# Patient Record
Sex: Male | Born: 2000 | Race: White | Hispanic: Yes | Marital: Single | Smoking: Never smoker
Health system: Southern US, Community
[De-identification: ages and names within clinical notes are randomized; demographics above are authoritative.]

---
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
## 2022-01-03 ENCOUNTER — Other Ambulatory Visit: Payer: Self-pay

## 2022-01-03 ENCOUNTER — Encounter (HOSPITAL_COMMUNITY): Payer: Self-pay | Admitting: Emergency Medicine

## 2022-01-03 ENCOUNTER — Emergency Department (HOSPITAL_COMMUNITY): Payer: Medicaid Other

## 2022-01-03 ENCOUNTER — Emergency Department (HOSPITAL_COMMUNITY)
Admission: EM | Admit: 2022-01-03 | Discharge: 2022-01-03 | Disposition: A | Discharge status: Home / Self Care | Payer: Medicaid Other | Attending: Emergency Medicine | Admitting: Emergency Medicine

## 2022-01-03 DIAGNOSIS — M25572 Pain in left ankle and joints of left foot: Secondary | ICD-10-CM | POA: Diagnosis not present

## 2022-01-03 DIAGNOSIS — Y9366 Activity, soccer: Secondary | ICD-10-CM | POA: Diagnosis not present

## 2022-01-03 DIAGNOSIS — X501XXA Overexertion from prolonged static or awkward postures, initial encounter: Secondary | ICD-10-CM | POA: Insufficient documentation

## 2022-01-03 DIAGNOSIS — S99912A Unspecified injury of left ankle, initial encounter: Secondary | ICD-10-CM | POA: Diagnosis present

## 2022-01-03 DIAGNOSIS — S93402A Sprain of unspecified ligament of left ankle, initial encounter: Secondary | ICD-10-CM | POA: Diagnosis not present

## 2022-01-03 NOTE — ED Triage Notes (Signed)
Pt reports left ankle pain after "rolling it" while playing soccer. No deformity noted.

## 2022-01-03 NOTE — Progress Notes (Signed)
Orthopedic Tech Progress Note Patient Details:  Emrik Erhard [birth year] [id number]  Ortho Devices Type of Ortho Device: ASO, Crutches Ortho Device/Splint Location: LLE Ortho Device/Splint Interventions: Ordered, Application, Adjustment   Post Interventions Patient Tolerated: Well, Ambulated well Instructions Provided: Adjustment of device, Poper ambulation with device, Care of device  [place] A Denisa Enterline 01/03/2022, 12:22 PM

## 2022-01-03 NOTE — ED Notes (Signed)
No signs of distress. Awaiting ortho tech to apply splint to ankle and do crutch walking teaching.

## 2022-01-03 NOTE — ED Provider Notes (Signed)
[facility] EMERGENCY DEPARTMENT Provider Note   CSN: [id number] Arrival date & time: 01/03/22  [id number]     History  Chief Complaint  Patient presents with   Ankle Pain    Gary Robinson is a 21 y.o. male.  The history is provided by the patient. No language interpreter was used.  Ankle Pain Location:  Ankle Time since incident:  1 day Injury: yes   Mechanism of injury comment:  Rolled ankle playing soccer Ankle location:  R ankle Pain details:    Quality:  Aching and throbbing   Radiates to:  Does not radiate   Severity:  Moderate   Onset quality:  Sudden   Timing:  Constant   Progression:  Unchanged Chronicity:  New Dislocation: no   Relieved by:  Immobilization Worsened by:  Bearing weight Associated symptoms: decreased ROM       Home Medications Prior to Admission medications   Medication Sig Start Date End Date Taking? Authorizing Provider  ibuprofen (ADVIL,MOTRIN) 600 MG tablet Take 1 tablet (600 mg total) by mouth every 6 (six) hours as needed for moderate pain. 11/21/17   Benjamine Sprague, NP      Allergies    Patient has no known allergies.    Review of Systems   Review of Systems  Physical Exam Updated Vital Signs Temp 98.2 F (36.8 C) (Oral)   Resp 17  Physical Exam Vitals and nursing note reviewed.  Constitutional:      General: Gary Robinson is not in acute distress.    Appearance: Gary Robinson is well-developed. Gary Robinson is not diaphoretic.  HENT:     Head: Normocephalic and atraumatic.  Eyes:     General: No scleral icterus.    Conjunctiva/sclera: Conjunctivae normal.  Cardiovascular:     Rate and Rhythm: Normal rate and regular rhythm.     Heart sounds: Normal heart sounds.  Pulmonary:     Effort: Pulmonary effort is normal. No respiratory distress.     Breath sounds: Normal breath sounds.  Abdominal:     Palpations: Abdomen is soft.     Tenderness: There is no abdominal tenderness.  Musculoskeletal:     Cervical back:  Normal range of motion and neck supple.     Comments: There is swelling and tenderness over the lateral malleolus.No overt deformity. No tenderness over the medial aspect of the ankle. The fifth metatarsal is not tender. The ankle joint is intact without excessive opening on stressing.   Skin:    General: Skin is warm and dry.  Neurological:     Mental Status: Gary Robinson is alert.  Psychiatric:        Behavior: Behavior normal.    ED Results / Procedures / Treatments   Labs (all labs ordered are listed, but only abnormal results are displayed) Labs Reviewed - No data to display  EKG None  Radiology No results found.  Procedures Procedures    Medications Ordered in ED Medications - No data to display  ED Course/ Medical Decision Making/ A&P Clinical Course as of 01/03/22 1151  Sun Jan 03, 2022  1130 DG Ankle Complete Left I visualized and interpreted  [AH]    Clinical Course User Index [AH] Margarita Mail, PA-C                           Medical Decision Making Gary Robinson is a 21 y.o. male who presents to ED for left ankle pain after mechanical injury.  LLE NVI. Exam c/w ankle sprain. X-ray negative for acute injury. ASO brace and crutches provided in ED. Home care instructions including RICE and NSAID's discussed. Follow up with ortho if symptoms not improving in 1 week. All questions answered.    Problems Addressed: Sprain of left ankle, unspecified ligament, initial encounter: acute illness or injury  Amount and/or Complexity of Data Reviewed Radiology: ordered.      Final Clinical Impression(s) / ED Diagnoses Final diagnoses:  None    Rx / DC Orders ED Discharge Orders     None         Margarita Mail, PA-C 01/03/22 1152    Blanchie Dessert, MD 01/07/22 [id number]

## 2022-01-03 NOTE — Discharge Instructions (Addendum)
Ice and elevate your foot. Take tylenol or advil for pain. You may bear weight as tolerated.  Follow up with an orthopedic doctor if you have any worsening pain.

## 2022-03-10 ENCOUNTER — Other Ambulatory Visit: Payer: Self-pay

## 2022-03-10 ENCOUNTER — Emergency Department (HOSPITAL_COMMUNITY)
Admission: EM | Admit: 2022-03-10 | Discharge: 2022-03-10 | Disposition: A | Discharge status: Home / Self Care | Payer: Medicaid Other | Attending: Emergency Medicine | Admitting: Emergency Medicine

## 2022-03-10 ENCOUNTER — Encounter (HOSPITAL_COMMUNITY): Payer: Self-pay

## 2022-03-10 DIAGNOSIS — R1033 Periumbilical pain: Secondary | ICD-10-CM | POA: Diagnosis not present

## 2022-03-10 DIAGNOSIS — R7309 Other abnormal glucose: Secondary | ICD-10-CM | POA: Insufficient documentation

## 2022-03-10 DIAGNOSIS — R1013 Epigastric pain: Secondary | ICD-10-CM | POA: Diagnosis present

## 2022-03-10 DIAGNOSIS — D72829 Elevated white blood cell count, unspecified: Secondary | ICD-10-CM | POA: Diagnosis not present

## 2022-03-10 DIAGNOSIS — K5900 Constipation, unspecified: Secondary | ICD-10-CM | POA: Diagnosis not present

## 2022-03-10 LAB — URINALYSIS, ROUTINE W REFLEX MICROSCOPIC
Bilirubin Urine: NEGATIVE
Glucose, UA: NEGATIVE mg/dL
Ketones, ur: 20 mg/dL — AB
Leukocytes,Ua: NEGATIVE
Nitrite: NEGATIVE
Protein, ur: 30 mg/dL — AB
Specific Gravity, Urine: 1.031 — ABNORMAL HIGH (ref 1.005–1.030)
pH: 5 (ref 5.0–8.0)

## 2022-03-10 LAB — COMPREHENSIVE METABOLIC PANEL
ALT: 18 U/L (ref 0–44)
AST: 18 U/L (ref 15–41)
Albumin: 4.7 g/dL (ref 3.5–5.0)
Alkaline Phosphatase: 69 U/L (ref 38–126)
Anion gap: 10 (ref 5–15)
BUN: 15 mg/dL (ref 6–20)
CO2: 22 mmol/L (ref 22–32)
Calcium: 9.3 mg/dL (ref 8.9–10.3)
Chloride: 106 mmol/L (ref 98–111)
Creatinine, Ser: 0.87 mg/dL (ref 0.61–1.24)
GFR, Estimated: >60 mL/min (ref >60)
Glucose, Bld: 108 mg/dL — ABNORMAL HIGH (ref 70–99)
Potassium: 3.8 mmol/L (ref 3.5–5.1)
Sodium: 138 mmol/L (ref 135–145)
Total Bilirubin: 0.7 mg/dL (ref 0.3–1.2)
Total Protein: 8.1 g/dL (ref 6.5–8.1)

## 2022-03-10 LAB — CBC
HCT: 47.6 % (ref 39.0–52.0)
Hemoglobin: 16.1 g/dL (ref 13.0–17.0)
MCH: 30.8 pg (ref 26.0–34.0)
MCHC: 33.8 g/dL (ref 30.0–36.0)
MCV: 91.2 fL (ref 80.0–100.0)
Platelets: 199 10*3/uL (ref 150–400)
RBC: 5.22 MIL/uL (ref 4.22–5.81)
RDW: 12.5 % (ref 11.5–15.5)
WBC: 11.3 10*3/uL — ABNORMAL HIGH (ref 4.0–10.5)
nRBC: 0 % (ref 0.0–0.2)

## 2022-03-10 LAB — LIPASE, BLOOD: Lipase: 28 U/L (ref 11–51)

## 2022-03-10 MED ORDER — FAMOTIDINE 20 MG PO TABS: 20.0000 mg | ORAL_TABLET | Freq: Two times a day (BID) | ORAL | 0 refills | Status: AC

## 2022-03-10 MED ORDER — SUCRALFATE 1 G PO TABS: 1.0000 g | ORAL_TABLET | Freq: Three times a day (TID) | ORAL | 0 refills | Status: AC

## 2022-03-10 NOTE — ED Provider Notes (Signed)
[facility] EMERGENCY DEPARTMENT Provider Note   CSN: [id number] Arrival date & time: 03/10/22  0757     History  Chief Complaint  Patient presents with   Abdominal Pain    Gary Robinson is a 21 y.o. male.  Patient with no past surgical history presents to the emergency department for evaluation of 2 months of periumbilical and epigastric abdominal pain.  Offered Spanish interpreter, patient prefers to speak [place].  Patient states that the symptoms feel like a "cramp".  Initially symptoms were coming and going.  He states that pain does seem to be worse after he eats or drinks.  Does report eating a lot of fast food while working.  Yesterday was working out in the heat and wound and felt the symptoms more so.  He decided that he wanted to get checked today.  He reports "fever" and chills yesterday morning and yesterday evening.  Did not measure his temperature.  This resolved without any treatment.  Denies NSAID use stating he does not like to take pills.  States that he drinks a beer approximately once a week, no daily use.  He does use tobacco.  Denies chest pain, cough.  Denies diarrhea.  Has had some constipation.  No irritative UTI symptoms.       Home Medications Prior to Admission medications   Medication Sig Start Date End Date Taking? Authorizing Provider  famotidine (PEPCID) 20 MG tablet Take 1 tablet (20 mg total) by mouth 2 (two) times daily. 03/10/22  Yes Renne Crigler, PA-C  sucralfate (CARAFATE) 1 g tablet Take 1 tablet (1 g total) by mouth 4 (four) times daily -  with meals and at bedtime. 03/10/22  Yes Renne Crigler, PA-C      Allergies    Patient has no known allergies.    Review of Systems   Review of Systems  Physical Exam Updated Vital Signs BP 133/77 (BP Location: Right Arm)   Pulse 83   Temp 98 F (36.7 C)   Resp 16   Ht 6' (1.829 m)   Wt 81.6 kg   SpO2 97%   BMI 24.41 kg/m   Physical Exam Vitals and nursing note  reviewed.  Constitutional:      General: He is not in acute distress.    Appearance: He is well-developed.  HENT:     Head: Normocephalic and atraumatic.  Eyes:     General:        Right eye: No discharge.        Left eye: No discharge.     Conjunctiva/sclera: Conjunctivae normal.  Cardiovascular:     Rate and Rhythm: Normal rate and regular rhythm.     Heart sounds: Normal heart sounds.  Pulmonary:     Effort: Pulmonary effort is normal.     Breath sounds: Normal breath sounds.  Abdominal:     Palpations: Abdomen is soft.     Tenderness: There is abdominal tenderness (minimal) in the epigastric area and periumbilical area. There is no guarding or rebound. Negative signs include Murphy's sign, Rovsing's sign and McBurney's sign.  Musculoskeletal:     Cervical back: Normal range of motion and neck supple.  Skin:    General: Skin is warm and dry.  Neurological:     Mental Status: [name].     ED Results / Procedures / Treatments   Labs (all labs ordered are listed, but only abnormal results are displayed) Labs Reviewed  COMPREHENSIVE METABOLIC PANEL - Abnormal;  Notable for the following components:      Result Value   Glucose, Bld 108 (*)    All other components within normal limits  CBC - Abnormal; Notable for the following components:   WBC 11.3 (*)    All other components within normal limits  URINALYSIS, ROUTINE W REFLEX MICROSCOPIC - Abnormal; Notable for the following components:   Specific Gravity, Urine 1.031 (*)    Hgb urine dipstick SMALL (*)    Ketones, ur 20 (*)    Protein, ur 30 (*)    Bacteria, UA RARE (*)    All other components within normal limits  LIPASE, BLOOD    EKG None  Radiology No results found.  Procedures Procedures    Medications Ordered in ED Medications - No data to display  ED Course/ Medical Decision Making/ A&P    Patient seen and examined. History obtained directly from patient. Work-up including labs, imaging, EKG  ordered in triage, if performed, were reviewed.    Labs/EKG: Independently reviewed and interpreted.  This included: CBC with minimally elevated white blood cell count at 11.3, normal hemoglobin; CMP with normal electrolytes, glucose minimally elevated at 108, normal kidney function, normal liver function testing; lipase normal; UA without compelling signs of infection.  Imaging: None ordered.  Considered CT or ultrasound but patient's symptoms are not consistent with biliary colic or with appendicitis.  No rebound or guarding to suggest perforated viscus.  Medications/Fluids: None ordered  Most recent vital signs reviewed and are as follows: BP 140/87 (BP Location: Right Arm)   Pulse 79   Temp 97.7 F (36.5 C) (Oral)   Resp 18   Ht 6' (1.829 m)   Wt 81.6 kg   SpO2 98%   BMI 24.41 kg/m   Initial impression: Epigastric abdominal pain  Home treatment plan: Avoidance of spicy and greasy foods, decrease tobacco use, trial of Pepcid and Carafate.  Return instructions discussed with patient: The patient was urged to return to the Emergency Department immediately with worsening of current symptoms, worsening abdominal pain, persistent vomiting, blood noted in stools, fever, or any other concerns. The patient verbalized understanding.   Follow-up instructions discussed with patient: F/u with PCP in 7 days.                           Medical Decision Making Amount and/or Complexity of Data Reviewed Labs: ordered.  Risk Prescription drug management.   For this patient's complaint of abdominal pain, the following conditions were considered on the differential diagnosis: gastritis/PUD, enteritis/duodenitis, appendicitis, cholelithiasis/cholecystitis, cholangitis, pancreatitis, ruptured viscus, colitis, diverticulitis, small/large bowel obstruction, proctitis, cystitis, pyelonephritis, ureteral colic, aortic dissection, aortic aneurysm. Atypical chest etiologies were also considered including  ACS, PE, and pneumonia.   The patient's vital signs, pertinent lab work and imaging were reviewed and interpreted as discussed in the ED course. Hospitalization was considered for further testing, treatments, or serial exams/observation. However as patient is well-appearing, has a stable exam, and reassuring studies today, I do not feel that they warrant admission at this time. This plan was discussed with the patient who verbalizes agreement and comfort with this plan and seems reliable and able to return to the Emergency Department with worsening or changing symptoms.          Final Clinical Impression(s) / ED Diagnoses Final diagnoses:  Epigastric abdominal pain    Rx / DC Orders ED Discharge Orders  Ordered    famotidine (PEPCID) 20 MG tablet  2 times daily        03/10/22 0951    sucralfate (CARAFATE) 1 g tablet  3 times daily with meals & bedtime        03/10/22 0951              Renne Crigler, PA-C 03/10/22 1001    Gloris Manchester, MD 03/15/22 1818

## 2022-03-10 NOTE — Discharge Instructions (Addendum)
Please read and follow all provided instructions.  Your diagnoses today include:  1. Epigastric abdominal pain     Tests performed today include: Blood cell counts and platelets: white blood cell count was just slightly high Kidney and liver function tests Pancreas function test (called lipase) Urine test to look for infection Vital signs. See below for your results today.   Medications prescribed:  Pepcid (famotidine) - antihistamine  You can find this medication over-the-counter.   DO NOT exceed:  20mg  Pepcid every 12 hours  Carafate - for stomach upset and to protect your stomach  Take any prescribed medications only as directed.  Home care instructions:  Follow any educational materials contained in this packet.  Follow-up instructions: Please follow-up with your primary care provider in the next 7 days for further evaluation of your symptoms.    Return instructions:  SEEK IMMEDIATE MEDICAL ATTENTION IF: The pain does not go away or becomes severe  A temperature above 101F develops  Repeated vomiting occurs (multiple episodes)  The pain becomes localized to portions of the abdomen. The right side could possibly be appendicitis. In an adult, the left lower portion of the abdomen could be colitis or diverticulitis.  Blood is being passed in stools or vomit (bright red or black tarry stools)  You develop chest pain, difficulty breathing, dizziness or fainting, or become confused, poorly responsive, or inconsolable (young children) If you have any other emergent concerns regarding your health  Additional Information: Abdominal (belly) pain can be caused by many things. Your caregiver performed an examination and possibly ordered blood/urine tests and imaging (CT scan, x-rays, ultrasound). Many cases can be observed and treated at home after initial evaluation in the emergency department. Even though you are being discharged home, abdominal pain can be unpredictable.  Therefore, you need a repeated exam if your pain does not resolve, returns, or worsens. Most patients with abdominal pain don't have to be admitted to the hospital or have surgery, but serious problems like appendicitis and gallbladder attacks can start out as nonspecific pain. Many abdominal conditions cannot be diagnosed in one visit, so follow-up evaluations are very important.  Your vital signs today were: BP 133/77 (BP Location: Right Arm)   Pulse 83   Temp 98 F (36.7 C)   Resp 16   Ht 6' (1.829 m)   Wt 81.6 kg   SpO2 97%   BMI 24.41 kg/m  If your blood pressure (bp) was elevated above 135/85 this visit, please have this repeated by your doctor within one month. --------------

## 2022-03-10 NOTE — ED Triage Notes (Signed)
Pt reports intermittent epigastric abd pain for the past 2 months. States he woke up with a fever yesterday. Denies n/v

## 2022-03-21 ENCOUNTER — Emergency Department (HOSPITAL_COMMUNITY)
Admission: EM | Admit: 2022-03-21 | Discharge: 2022-03-22 | Disposition: A | Discharge status: Home / Self Care | Payer: Medicaid Other | Attending: Emergency Medicine | Admitting: Emergency Medicine

## 2022-03-21 DIAGNOSIS — N50819 Testicular pain, unspecified: Secondary | ICD-10-CM | POA: Diagnosis not present

## 2022-03-21 DIAGNOSIS — R1032 Left lower quadrant pain: Secondary | ICD-10-CM | POA: Insufficient documentation

## 2022-03-21 DIAGNOSIS — Z5321 Procedure and treatment not carried out due to patient leaving prior to being seen by health care provider: Secondary | ICD-10-CM | POA: Diagnosis not present

## 2022-03-21 LAB — CBC
HCT: 44.5 % (ref 39.0–52.0)
Hemoglobin: 15.4 g/dL (ref 13.0–17.0)
MCH: 31.2 pg (ref 26.0–34.0)
MCHC: 34.6 g/dL (ref 30.0–36.0)
MCV: 90.3 fL (ref 80.0–100.0)
Platelets: 264 10*3/uL (ref 150–400)
RBC: 4.93 MIL/uL (ref 4.22–5.81)
RDW: 12.1 % (ref 11.5–15.5)
WBC: 9.1 10*3/uL (ref 4.0–10.5)
nRBC: 0 % (ref 0.0–0.2)

## 2022-03-21 LAB — URINALYSIS, ROUTINE W REFLEX MICROSCOPIC
Bilirubin Urine: NEGATIVE
Glucose, UA: NEGATIVE mg/dL
Hgb urine dipstick: NEGATIVE
Ketones, ur: NEGATIVE mg/dL
Leukocytes,Ua: NEGATIVE
Nitrite: NEGATIVE
Protein, ur: NEGATIVE mg/dL
Specific Gravity, Urine: 1.02 (ref 1.005–1.030)
pH: 6 (ref 5.0–8.0)

## 2022-03-21 LAB — COMPREHENSIVE METABOLIC PANEL
ALT: 16 U/L (ref 0–44)
AST: 15 U/L (ref 15–41)
Albumin: 4.3 g/dL (ref 3.5–5.0)
Alkaline Phosphatase: 67 U/L (ref 38–126)
Anion gap: 5 (ref 5–15)
BUN: 12 mg/dL (ref 6–20)
CO2: 23 mmol/L (ref 22–32)
Calcium: 9.2 mg/dL (ref 8.9–10.3)
Chloride: 110 mmol/L (ref 98–111)
Creatinine, Ser: 0.87 mg/dL (ref 0.61–1.24)
GFR, Estimated: >60 mL/min (ref >60)
Glucose, Bld: 98 mg/dL (ref 70–99)
Potassium: 3.8 mmol/L (ref 3.5–5.1)
Sodium: 138 mmol/L (ref 135–145)
Total Bilirubin: 0.7 mg/dL (ref 0.3–1.2)
Total Protein: 7 g/dL (ref 6.5–8.1)

## 2022-03-21 LAB — LIPASE, BLOOD: Lipase: 31 U/L (ref 11–51)

## 2022-03-21 NOTE — ED Triage Notes (Signed)
Pt c/o sudden onset LRQ abd pain at 7p, worse when using the bathroom. Endorses brief episode of testicular pain "earlier," but denies additional symptoms now. Denies N/V, associated symptoms.

## 2022-03-22 NOTE — ED Notes (Signed)
X2 no response for vitals recheck  

## 2023-11-02 IMAGING — DX DG ANKLE COMPLETE 3+V*L*
3 series · 3 of 3 positions shown · non-contrast
Comparison: None Available.

CLINICAL DATA: Acute LEFT ankle pain following injury yesterday.
Initial encounter.

EXAM:
LEFT ANKLE COMPLETE - 3+ VIEW

[ankle ap]
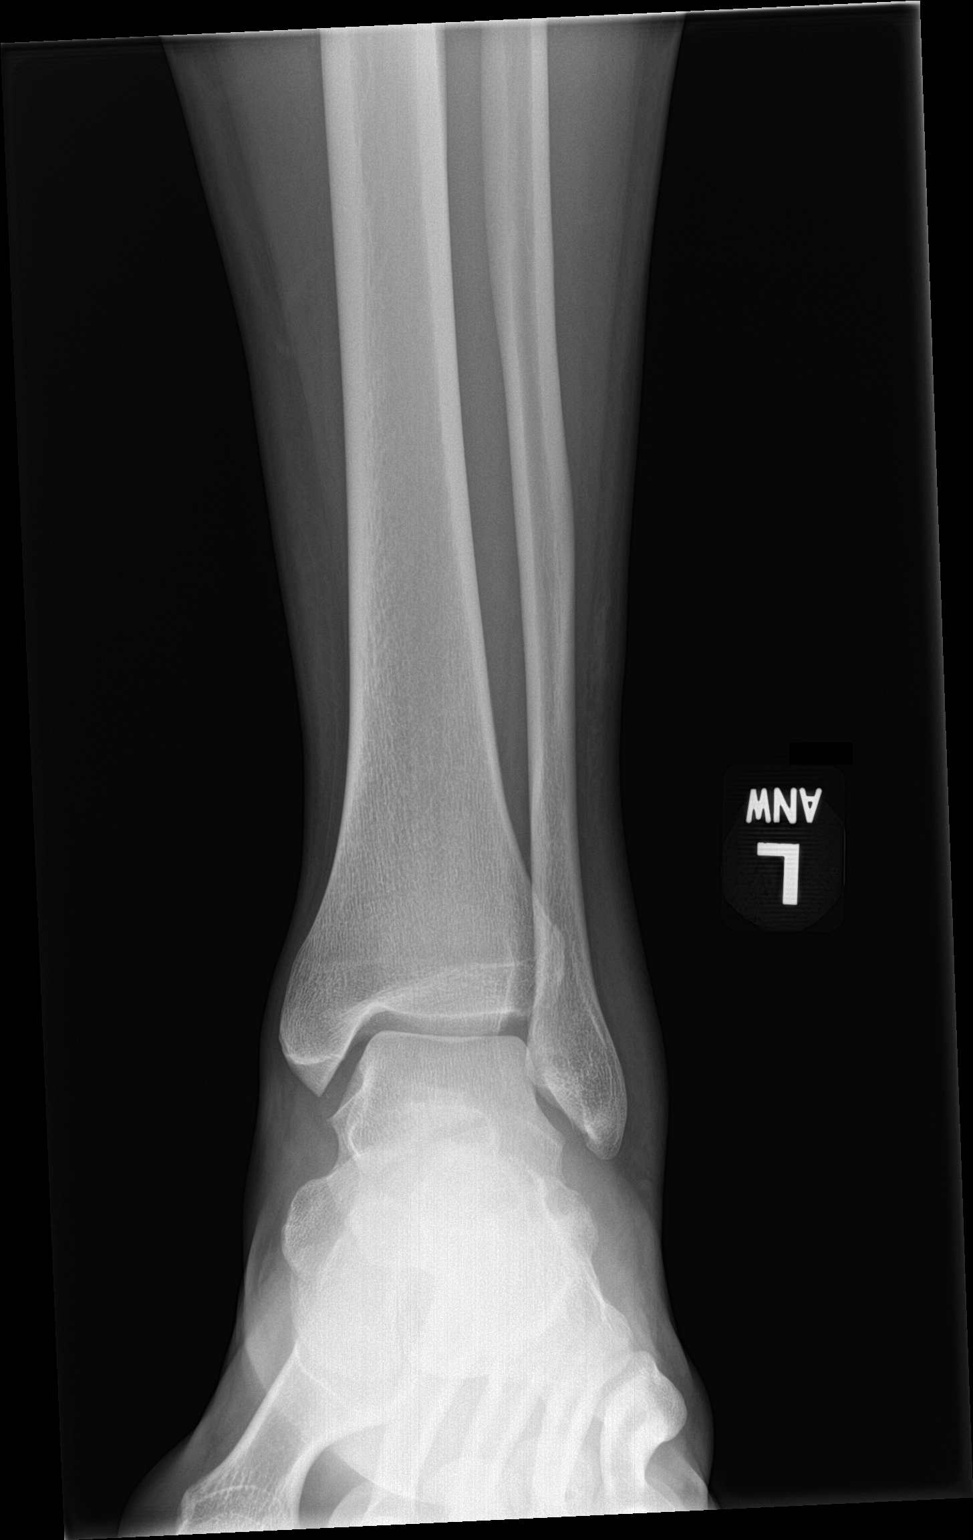

[ankle obl]
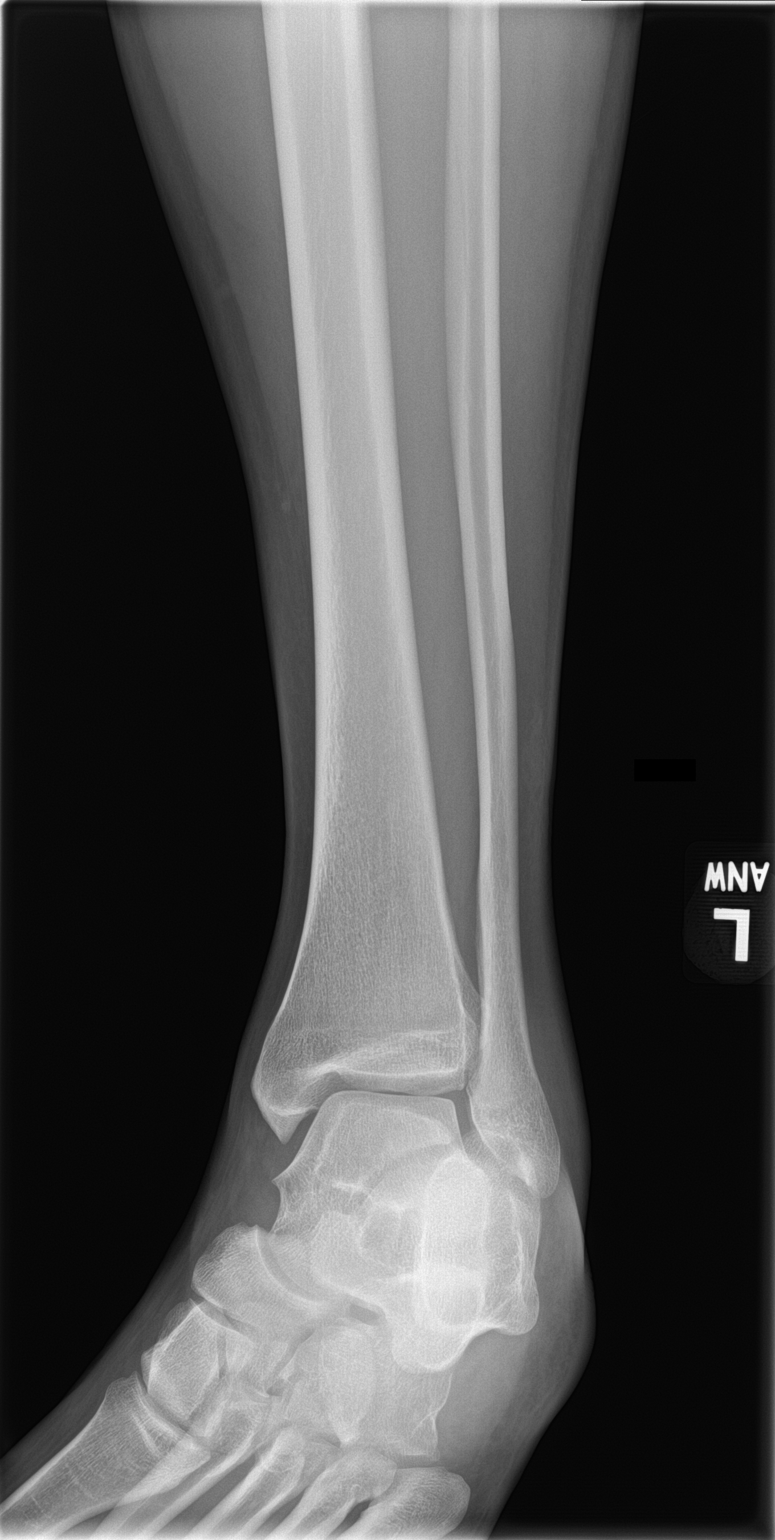

[ankle lat]
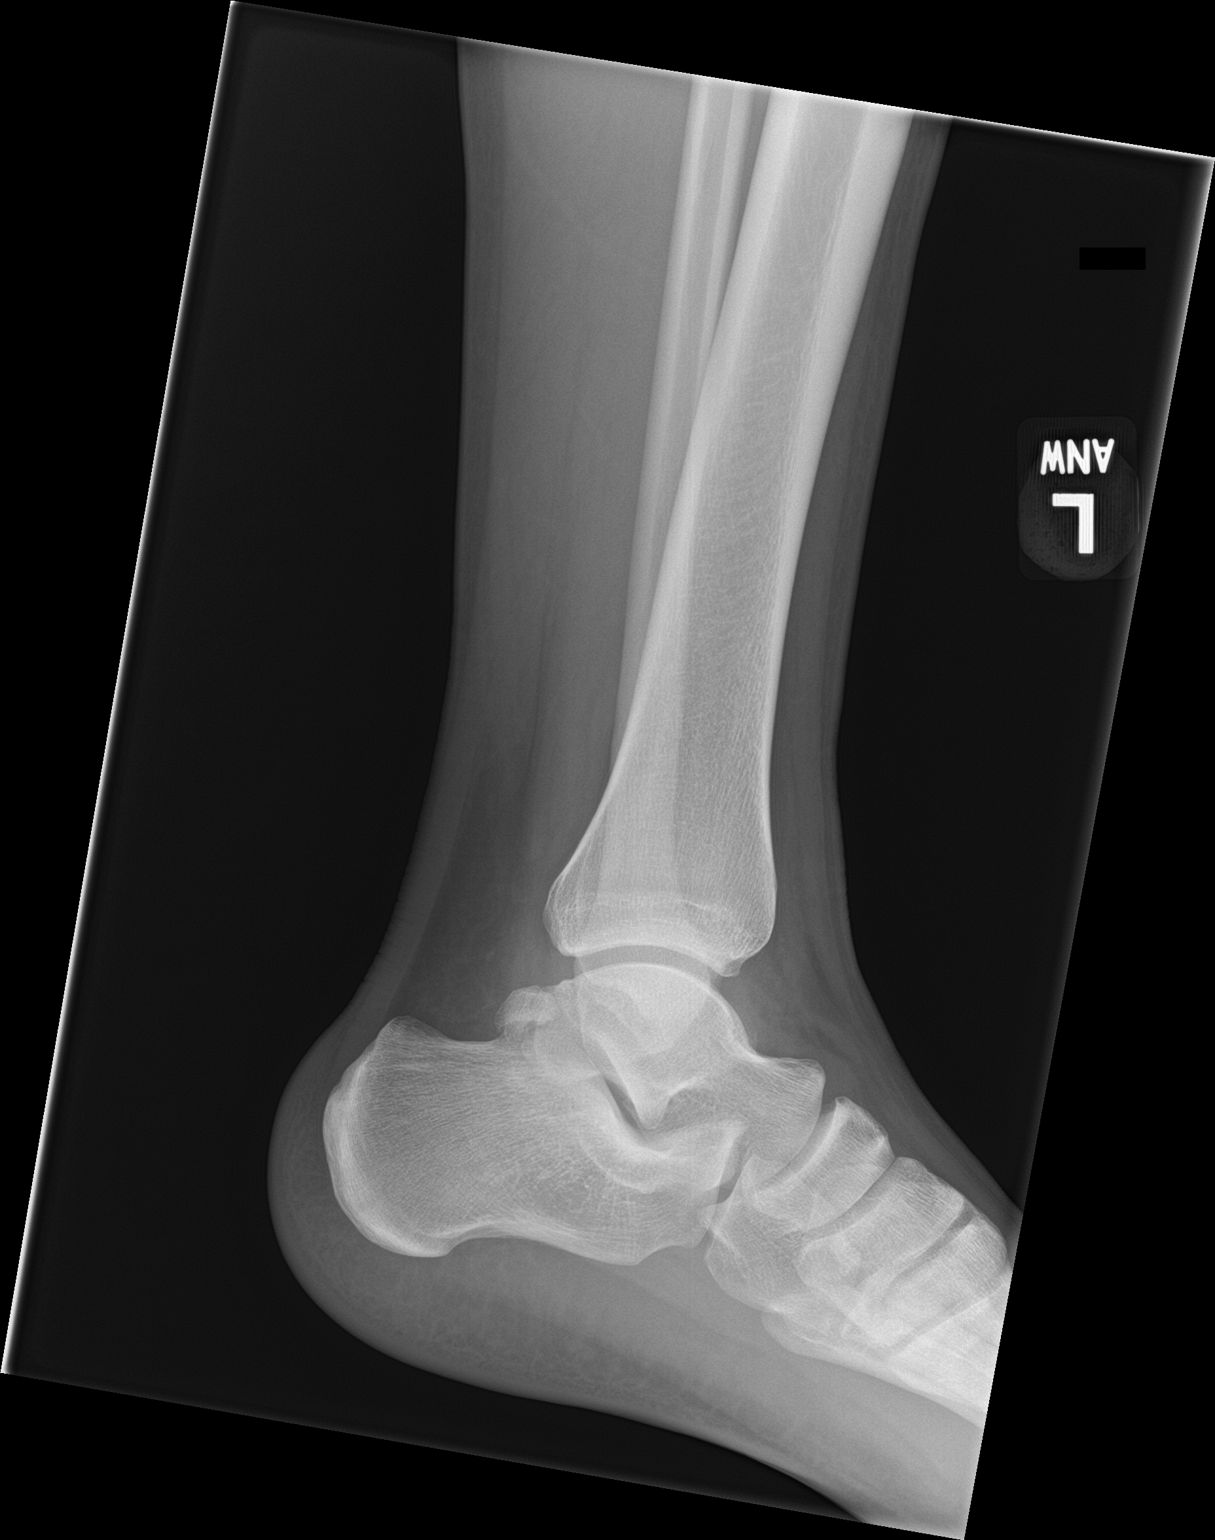

[3 of 3 positions shown; findings below may reference images not displayed]

FINDINGS: There is no evidence of acute fracture, subluxation or dislocation.

The joint spaces are unremarkable.

LATERAL soft tissue swelling is noted.

No focal bony lesions are noted.
IMPRESSION: LATERAL soft tissue swelling without acute bony abnormality.

## 2024-01-11 ENCOUNTER — Encounter (HOSPITAL_COMMUNITY): Payer: Self-pay

## 2024-01-11 ENCOUNTER — Ambulatory Visit (HOSPITAL_COMMUNITY)
Admission: EM | Admit: 2024-01-11 | Discharge: 2024-01-11 | Disposition: A | Discharge status: Home / Self Care | Attending: Emergency Medicine | Admitting: Emergency Medicine

## 2024-01-11 DIAGNOSIS — H1013 Acute atopic conjunctivitis, bilateral: Secondary | ICD-10-CM

## 2024-01-11 MED ORDER — OLOPATADINE HCL 0.1 % OP SOLN: 1.0000 [drp] | Freq: Two times a day (BID) | OPHTHALMIC | 0 refills | Status: AC

## 2024-01-11 NOTE — ED Triage Notes (Signed)
 Pt c/o burning pain to bilat eyes (R more so than L) x3-4 days. The burning occurs when his eyes are watered and/or when exposed to bright lights. Pt states he was working over the weekend and the sunlight reflecting off the metal was burning his eyes. No concern for foreign body. States has had intermittent blurred vision.

## 2024-01-11 NOTE — ED Provider Notes (Signed)
 [facility]    CSN: [id number] Arrival date & time: 01/11/24  1457      History   Chief Complaint Chief Complaint  Patient presents with   Eye Problem    HPI Gary Robinson is a 23 y.o. male.   Patient presents with bilateral eye irritation and itching x 3 to 4 days.  Patient states that his right eye at times is more irritated and itching more than his left.  Patient states over the weekend he was working outside and this is when this began.    Patient states that sometimes when light hits his eyes it irritates the eye more.  Patient denies foreign body sensation, or eye pain.  Patient states that occasionally when looking up a light his eyes will become watery and he will start to have some mild blurred vision with this.  Denies persistent blurred vision, dizziness, headache, congestion.  The history is provided by the patient and medical records.  Eye Problem   History reviewed. No pertinent past medical history.  There are no active problems to display for this patient.   History reviewed. No pertinent surgical history.     Home Medications    Prior to Admission medications   Medication Sig Start Date End Date Taking? Authorizing Provider  olopatadine (PATADAY) 0.1 % ophthalmic solution Place 1 drop into both eyes 2 (two) times daily. 01/11/24  Yes Rosevelt Constable, Kalimah Capurro A, NP  famotidine  (PEPCID ) 20 MG tablet Take 1 tablet (20 mg total) by mouth 2 (two) times daily. 03/10/22   Geiple, Joshua, PA-C  sucralfate  (CARAFATE ) 1 g tablet Take 1 tablet (1 g total) by mouth 4 (four) times daily -  with meals and at bedtime. 03/10/22   Lyna Sandhoff, PA-C    Family History History reviewed. No pertinent family history.  Social History Social History   Tobacco Use   Smoking status: Never  Vaping Use   Vaping status: Never Used  Substance Use Topics   Alcohol use: No   Drug use: Yes    Types: Marijuana     Allergies   Patient has no known  allergies.   Review of Systems Review of Systems  Per HPI  Physical Exam Triage Vital Signs ED Triage Vitals  Encounter Vitals Group     BP 01/11/24 1632 134/83     Systolic BP Percentile --      Diastolic BP Percentile --      Pulse Rate 01/11/24 1632 63     Resp 01/11/24 1632 18     Temp 01/11/24 1632 98.3 F (36.8 C)     Temp Source 01/11/24 1632 Oral     SpO2 01/11/24 1632 97 %     Weight --      Height --      Head Circumference --      Peak Flow --      Pain Score 01/11/24 1631 0     Pain Loc --      Pain Education --      Exclude from Growth Chart --    No data found.  Updated Vital Signs BP 134/83 (BP Location: Left Arm)   Pulse 63   Temp 98.3 F (36.8 C) (Oral)   Resp 18   SpO2 97%   Visual Acuity Right Eye Distance: 20/20 Left Eye Distance: 20/15 Bilateral Distance: 20/15  Right Eye Near:   Left Eye Near:    Bilateral Near:     Physical Exam Vitals and  nursing note reviewed.  Constitutional:      General: He is awake. He is not in acute distress.    Appearance: Normal appearance. He is well-developed and well-groomed. He is not ill-appearing.  Eyes:     General: Lids are normal.        Right eye: No discharge.        Left eye: No discharge.     Extraocular Movements: Extraocular movements intact.     Conjunctiva/sclera:     Right eye: Right conjunctiva is injected. No exudate or hemorrhage.    Left eye: Left conjunctiva is not injected. No exudate or hemorrhage.    Pupils: Pupils are equal, round, and reactive to light.  Neurological:     Mental Status: [name].  Psychiatric:        Behavior: Behavior is cooperative.      UC Treatments / Results  Labs (all labs ordered are listed, but only abnormal results are displayed) Labs Reviewed - No data to display  EKG   Radiology No results found.  Procedures Procedures (including critical care time)  Medications Ordered in UC Medications - No data to display  Initial  Impression / Assessment and Plan / UC Course  I have reviewed the triage vital signs and the nursing notes.  Pertinent labs & imaging results that were available during my care of the patient were reviewed by me and considered in my medical decision making (see chart for details).     Patient is well-appearing.  Vitals are stable.  Upon assessment there is mild injection noted to the right conjunctiva.  There is no discharge or swelling present.  Visual acuity within normal limits.  Prescribed Pataday for allergic conjunctivitis.  Discussed return precautions. Final Clinical Impressions(s) / UC Diagnoses   Final diagnoses:  Allergic conjunctivitis of both eyes     Discharge Instructions      Apply Pataday eyedrops twice daily as needed for eye irritation and itching. Return symptoms persist or worsen.  ED Prescriptions     Medication Sig Dispense Auth. Provider   olopatadine (PATADAY) 0.1 % ophthalmic solution Place 1 drop into both eyes 2 (two) times daily. 5 mL Levora Reas A, NP      PDMP not reviewed this encounter.   Levora Reas A, NP 01/11/24 [phone]

## 2024-01-11 NOTE — Discharge Instructions (Addendum)
 Apply Pataday eyedrops twice daily as needed for eye irritation and itching. Return symptoms persist or worsen.
